# Patient Record
Sex: Male | Born: 2005
Health system: Southern US, Community
[De-identification: ages and names within clinical notes are randomized; demographics above are authoritative.]

## PROBLEM LIST (undated history)

## (undated) DIAGNOSIS — M419 Scoliosis, unspecified: Secondary | ICD-10-CM

## (undated) HISTORY — DX: Scoliosis, unspecified: M41.9

---
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
## 2024-01-24 ENCOUNTER — Encounter (HOSPITAL_BASED_OUTPATIENT_CLINIC_OR_DEPARTMENT_OTHER): Payer: BC Managed Care – PPO | Admitting: Family Medicine
